# Patient Record
Sex: Female | Born: 2005 | Hispanic: Yes | Marital: Single | Smoking: Never smoker
Health system: Southern US, Community
[De-identification: ages and names within clinical notes are randomized; demographics above are authoritative.]

## PROBLEM LIST (undated history)

## (undated) DIAGNOSIS — Z789 Other specified health status: Secondary | ICD-10-CM

## (undated) HISTORY — DX: Other specified health status: Z78.9

## (undated) HISTORY — PX: CHOLECYSTECTOMY: SHX55

## (undated) HISTORY — PX: NO PAST SURGERIES: SHX2092

---
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
## 2023-01-21 DIAGNOSIS — Z419 Encounter for procedure for purposes other than remedying health state, unspecified: Secondary | ICD-10-CM | POA: Diagnosis not present

## 2023-02-20 DIAGNOSIS — Z419 Encounter for procedure for purposes other than remedying health state, unspecified: Secondary | ICD-10-CM | POA: Diagnosis not present

## 2023-02-22 ENCOUNTER — Encounter: Payer: Self-pay | Admitting: Certified Nurse Midwife

## 2023-03-23 DIAGNOSIS — Z419 Encounter for procedure for purposes other than remedying health state, unspecified: Secondary | ICD-10-CM | POA: Diagnosis not present

## 2023-04-23 DIAGNOSIS — Z419 Encounter for procedure for purposes other than remedying health state, unspecified: Secondary | ICD-10-CM | POA: Diagnosis not present

## 2023-05-23 DIAGNOSIS — Z419 Encounter for procedure for purposes other than remedying health state, unspecified: Secondary | ICD-10-CM | POA: Diagnosis not present

## 2023-06-23 DIAGNOSIS — Z419 Encounter for procedure for purposes other than remedying health state, unspecified: Secondary | ICD-10-CM | POA: Diagnosis not present

## 2023-07-23 DIAGNOSIS — Z419 Encounter for procedure for purposes other than remedying health state, unspecified: Secondary | ICD-10-CM | POA: Diagnosis not present

## 2023-08-23 DIAGNOSIS — Z419 Encounter for procedure for purposes other than remedying health state, unspecified: Secondary | ICD-10-CM | POA: Diagnosis not present

## 2023-09-23 DIAGNOSIS — Z419 Encounter for procedure for purposes other than remedying health state, unspecified: Secondary | ICD-10-CM | POA: Diagnosis not present

## 2023-10-21 DIAGNOSIS — Z419 Encounter for procedure for purposes other than remedying health state, unspecified: Secondary | ICD-10-CM | POA: Diagnosis not present

## 2023-12-02 DIAGNOSIS — Z419 Encounter for procedure for purposes other than remedying health state, unspecified: Secondary | ICD-10-CM | POA: Diagnosis not present

## 2024-07-31 ENCOUNTER — Encounter: Payer: Worker's Compensation | Admitting: Physical Medicine & Rehabilitation

## 2024-07-31 ENCOUNTER — Encounter: Payer: Self-pay | Admitting: Physical Medicine & Rehabilitation

## 2024-07-31 VITALS — BP 106/68 | HR 63 | Ht <= 58 in | Wt 105.4 lb

## 2024-07-31 DIAGNOSIS — F0781 Postconcussional syndrome: Secondary | ICD-10-CM | POA: Insufficient documentation

## 2024-07-31 DIAGNOSIS — M7918 Myalgia, other site: Secondary | ICD-10-CM | POA: Insufficient documentation

## 2024-07-31 DIAGNOSIS — M47812 Spondylosis without myelopathy or radiculopathy, cervical region: Secondary | ICD-10-CM | POA: Diagnosis present

## 2024-07-31 MED ORDER — CYCLOBENZAPRINE HCL 5 MG PO TABS: 5.0000 mg | ORAL_TABLET | Freq: Two times a day (BID) | ORAL | 0 refills | Status: AC | PRN

## 2024-07-31 NOTE — Progress Notes (Signed)
 Subjective:    Patient ID: Sarah Oneal, female    DOB: October 04, 2005, 18 y.o.   MRN: [id number]  HPI Discussed the use of AI scribe software for clinical note transcription with the patient, who gave verbal consent to proceed.  History of Present Illness Sarah Oneal is a 18 year old female who presents with ongoing concussion symptoms. She was referred by her neurologist for evaluation of her concussion symptoms.  Concussion symptoms - Sustained concussion on April 20, 2023, when heavy boxes fell on head and neck while unloading a truck at [employer] - Loss of consciousness at the time of injury; duration unknown - Initial symptoms included headaches and dizziness occurring a couple of times per week - No significant findings on CT scan performed at the time of injury  Headache and photophobia - Current headaches primarily triggered by neck and upper back pain - Headaches managed by changing positions and taking ibuprofen  600 mg, which is effective - No use of Tylenol  - Sensitivity to lights (photophobia) persists and is bothersome - No issues with noise sensitivity, memory, or cognitive function - Compazine used for nausea associated with headaches in the initial months post-injury  Vestibular dysfunction and dizziness - Initial dizziness improved significantly with vestibular therapy at [facility] in [place] (eye and head movement exercises) - Occasional dizziness persists, especially when bending down, cleaning, or turning quickly - Dizziness described as a spinning sensation rather than lightheadedness - Balance generally good, with only occasional dizziness  Neck and upper back pain - Neck and upper back pain present since injury, serving as a trigger for headaches - Pain previously managed with cyclobenzaprine, tizanidine, meloxicam, and ibuprofen  - No current use of pain medications since August 2025 due to lack of new prescriptions  Functional  limitations and occupational impact - Cleared for light physical duty after work evaluation - Difficulty performing tasks at [employer] due to physical demands of the job    Pain Inventory Average Pain 8-9 Pain Right Now 8 My pain is intermittent dull aching  In the last 24 hours, has pain interfered with the following? General activity 9 Relation with others 7 Enjoyment of life 9 What TIME of day is your pain at its worst? Daytime and night Sleep (in general) Fair  Pain is worse with: walking, bending, standing and some activities Pain improves with: rest, therapy/exercise, pacing activities Relief from Meds: 7  walk without assistance how many minutes can you walk? 10-20 minutes ability to climb steps?  yes do you drive?  no  not employed: date last employed August 2024 due to injury  No problems in this area weakness dizziness  Any changes since last visit?  no  N/a    Family History  Problem Relation Age of Onset   Healthy Mother    Healthy Father    Healthy Maternal Grandmother    Heart attack Maternal Grandfather    Breast cancer Neg Hx    Ovarian cancer Neg Hx    Colon cancer Neg Hx    Social History   Socioeconomic History   Marital status: Single    Spouse name: Not on file   Number of children: Not on file   Years of education: Not on file   Highest education level: Not on file  Occupational History   Not on file  Tobacco Use   Smoking status: Never   Smokeless tobacco: Never  Vaping Use   Vaping status: Never Used  Substance and Sexual Activity  Alcohol use: Never   Drug use: Never   Sexual activity: Not Currently    Birth control/protection: Pill  Other Topics Concern   Not on file  Social History Narrative   Not on file   Social Drivers of Health   Financial Resource Strain: Not on file  Food Insecurity: Not on file  Transportation Needs: Not on file  Physical Activity: Inactive (08/28/2018)   Exercise Vital Sign    Days of Exercise  per Week: 0 days    Minutes of Exercise per Session: 0 min  Stress: Not on file  Social Connections: Not on file   Past Surgical History:  Procedure Laterality Date   CHOLECYSTECTOMY     NO PAST SURGERIES     Past Medical History:  Diagnosis Date   No pertinent past medical history    BP 106/68 (BP Location: Left Arm, Patient Position: Sitting, Cuff Size: Normal)   Pulse 63   Ht 4' 8 (1.422 m)   Wt 105 lb 6.4 oz (47.8 kg)   SpO2 98%   BMI 23.63 kg/m   Opioid Risk Score:   Fall Risk Score:  `1  Depression screen [facility] 2/9     07/31/2024   10:24 AM 11/28/2018   10:35 AM  Depression screen PHQ 2/9  Decreased Interest 0 0  Down, Depressed, Hopeless 0 0  PHQ - 2 Score 0 0  Altered sleeping 2 0  Tired, decreased energy 0 0  Change in appetite 0 0  Feeling bad or failure about yourself  0 0  Trouble concentrating 0 0  Moving slowly or fidgety/restless 0 0  Suicidal thoughts 0 0  PHQ-9 Score 2 0   Difficult doing work/chores Not difficult at all      Data saved with a previous flowsheet row definition      Review of Systems  Musculoskeletal:  Positive for back pain and neck pain.       Head pain, neck pain, mid to upper back pain  Neurological:  Positive for dizziness and headaches.       Objective:   Physical Exam   General: Alert and oriented x 3, No apparent distress HEENT: Head is normocephalic, atraumatic, PERRLA, EOMI, sclera anicteric, oral mucosa pink and moist, dentition intact, ext ear canals clear,  Neck: Supple without JVD or lymphadenopathy Heart: Reg rate and rhythm. No murmurs rubs or gallops Chest: CTA bilaterally without wheezes, rales, or rhonchi; no distress Abdomen: Soft, non-tender, non-distended, bowel sounds positive. Extremities: No clubbing, cyanosis, or edema. Pulses are 2+ Psych: Pt's affect is appropriate. Pt is cooperative Skin: Clean and intact without signs of breakdown Neuro:  Alert and oriented x 3. Normal insight and  awareness. Intact Memory. Normal language and speech. Cranial nerve exam unremarkable Cognitive testing normal. MMT: 5/5 in all 4's. Sensory exam normal for light touch and pain in all 4 limbs. No limb ataxia or cerebellar signs. No abnormal tone appreciated.  .   Musculoskeletal: Patient with scattered trigger points along the lower to mid traps as well as the rhomboids.  She had some discomfort with pressure in these areas as well as palpation into the mid to upper neck.  She was limited with flexion and extension today due to pain.  She appears to have functional shoulder range of motion bilaterally.      Assessment & Plan:   Assessment and Plan Assessment & Plan Postconcussion syndrome  -cognitively she's intact. No recs from that standpoint  -sleep is intact  Persistent dizziness and headaches with photophobia and vertigo. Cognitive function intact. Previous vestibular therapy provided relief. No current headache medication since August. - Referred to [facility] for eye and balance therapy. - Continue ibuprofen  600 mg up to three to four times daily. - Prescribed cyclobenzaprine as a muscle relaxant.  Cervical disc disease with spondylosis MRI shows disc protrusions at C3-C4, C4-C5, and C5-C6 with spondylotic changes. Symptoms include neck and upper back pain, headaches triggered by neck movements. Previous therapy provided relief but was discontinued. - Recommended resuming physical therapy with neck traction using a home traction unit as well.. - Continue ibuprofen  600 mg up to three to four times daily. - Prescribed cyclobenzaprine as a muscle relaxant.   Forty-five minutes of face to face patient care time were spent during this visit. All questions were encouraged and answered. Follow up with me in 2 mos.   It was a pleasure to meet Sarah Oneal!SABRA

## 2024-07-31 NOTE — Patient Instructions (Addendum)
 [employer] [address] [place], [place], [place] [address] Hours:  Open  Closes 12:30?PM  Reopens 1:30?PM Phone: [phone]    VISIT SUMMARY: Today, you were seen for ongoing symptoms related to your concussion, including headaches, dizziness, and neck and upper back pain. We discussed your current symptoms and reviewed your treatment options.  YOUR PLAN: -POSTCONCUSSION SYNDROME: Postconcussion syndrome is a condition where concussion symptoms persist for weeks or months after the initial injury. You will continue taking ibuprofen  600 mg up to three to four times daily for headaches. You are also referred to [facility] for eye and balance therapy to help with your dizziness and photophobia. Additionally, cyclobenzaprine has been prescribed as a muscle relaxant to help with your symptoms.  -CERVICAL DISC DISEASE WITH SPONDYLOSIS: Cervical disc disease with spondylosis involves the degeneration of the discs in your neck, which can cause pain and other symptoms. Your MRI shows disc protrusions at C3-C4, C4-C5, and C5-C6. You should resume physical therapy with neck traction using a home traction unit. Continue taking ibuprofen  600 mg up to three to four times daily, and cyclobenzaprine has been prescribed as a muscle relaxant to help manage your pain.  INSTRUCTIONS: Will refer to [facility] for your eye and balance therapy. Recommend that you resume physical therapy with neck traction at home. Continue taking ibuprofen  and start the prescribed cyclobenzaprine as directed. If your symptoms do not improve or worsen, please schedule a follow-up appointment.

## 2024-10-02 ENCOUNTER — Encounter: Admitting: Physical Medicine & Rehabilitation
